# Patient Record
Sex: Male | Born: 2000 | Race: Black or African American | Hispanic: No | Marital: Single | Smoking: Never smoker
Health system: Southern US, Community
[De-identification: ages and names within clinical notes are randomized; demographics above are authoritative.]

---
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
## 2020-12-28 ENCOUNTER — Ambulatory Visit
Admission: RE | Admit: 2020-12-28 | Discharge: 2020-12-28 | Disposition: A | Discharge status: Home / Self Care | Payer: Self-pay | Source: Ambulatory Visit | Attending: Nurse Practitioner | Admitting: Nurse Practitioner

## 2020-12-28 ENCOUNTER — Other Ambulatory Visit: Payer: Self-pay | Admitting: Nurse Practitioner

## 2020-12-28 ENCOUNTER — Other Ambulatory Visit: Payer: Self-pay

## 2020-12-28 DIAGNOSIS — Z021 Encounter for pre-employment examination: Secondary | ICD-10-CM

## 2021-06-30 ENCOUNTER — Ambulatory Visit (HOSPITAL_BASED_OUTPATIENT_CLINIC_OR_DEPARTMENT_OTHER): Payer: 59 | Admitting: Family Medicine

## 2022-04-06 ENCOUNTER — Ambulatory Visit (HOSPITAL_BASED_OUTPATIENT_CLINIC_OR_DEPARTMENT_OTHER): Payer: BC Managed Care – PPO | Admitting: Family Medicine

## 2022-04-06 ENCOUNTER — Encounter (HOSPITAL_BASED_OUTPATIENT_CLINIC_OR_DEPARTMENT_OTHER): Payer: Self-pay | Admitting: Family Medicine

## 2022-04-06 DIAGNOSIS — Z0000 Encounter for general adult medical examination without abnormal findings: Secondary | ICD-10-CM

## 2022-04-06 DIAGNOSIS — Z7689 Persons encountering health services in other specified circumstances: Secondary | ICD-10-CM | POA: Insufficient documentation

## 2022-04-06 DIAGNOSIS — Z23 Encounter for immunization: Secondary | ICD-10-CM

## 2022-04-06 DIAGNOSIS — R0789 Other chest pain: Secondary | ICD-10-CM | POA: Diagnosis not present

## 2022-04-06 NOTE — Patient Instructions (Signed)
  Medication Instructions:  Your physician recommends that you continue on your current medications as directed. Please refer to the Current Medication list given to you today. --If you need a refill on any your medications before your next appointment, please call your pharmacy first. If no refills are authorized on file call the office.-- Lab Work: Your physician has recommended that you have lab work today: Yes If you have labs (blood work) drawn today and your tests are completely normal, you will receive your results via MyChart message OR a phone call from our staff.  Please ensure you check your voicemail in the event that you authorized detailed messages to be left on a delegated number. If you have any lab test that is abnormal or we need to change your treatment, we will call you to review the results.  Referrals/Procedures/Imaging: No  Follow-Up: Your next appointment:   Your physician recommends that you schedule a follow-up appointment in: 4-6 weeks with Dr. de Cuba.  You will receive a text message or e-mail with a link to a survey about your care and experience with us today! We would greatly appreciate your feedback!   Thanks for letting us be apart of your health journey!!  Primary Care and Sports Medicine   Dr. Raymond de Cuba   We encourage you to activate your patient portal called "MyChart".  Sign up information is provided on this After Visit Summary.  MyChart is used to connect with patients for Virtual Visits (Telemedicine).  Patients are able to view lab/test results, encounter notes, upcoming appointments, etc.  Non-urgent messages can be sent to your provider as well. To learn more about what you can do with MyChart, please visit --  [email or web address].    

## 2022-04-06 NOTE — Assessment & Plan Note (Signed)
We will arrange for CPE to be completed in the coming weeks, patient will complete labs today in anticipation of that appointment He does have some intermittent chest cramping/discomfort which do not appear to be related to cardiac etiology, recommend continue with monitoring He reports that some of his family numbers have had issues with seizures, he is not sure about any specific etiology which has been determined for these seizures.  He did have some questions about whether these may be placing high risk for him to developing seizures in the future.  We did discuss that for certain seizure etiologies, this could increase his risk, although there are numerous other causes of seizures which would not correlate with him having increased risk of having seizures in the future

## 2022-04-06 NOTE — Progress Notes (Signed)
New Patient Office Visit  Subjective    Patient ID: Henry Sanders, male    DOB: 2001/01/30  Age: 21 y.o. MRN: [id number]  CC:  Chief Complaint  Patient presents with   New Patient (Initial Visit)    Pt here to establish new care, pt only concern is he stated he works out and he has chest pain sometimes     HPI Henry Sanders presents to establish care Last PCP - pediatrician a few years ago  Denies any chronic medical issues, no regular medications taken Denies prior surgeries or hospitalizations Family history of HTN on father's side of family. Has had some occasional chest pain/cramping - this has primarily been associated with exercise, generally will feel symptoms the day following after chest related strength training.  Has not had symptoms otherwise, no symptoms with day-to-day activities.  No associated lightheadedness, dizziness or shortness of breath.  Patient is originally from [place]. He is currently working, works in Programmer, applications. Outside of work, he enjoys exercising, sports, hiking, traveling.  Outpatient Encounter Medications as of 04/06/2022  Medication Sig   [DISCONTINUED] meloxicam (MOBIC) 15 MG tablet Take 1 tablet (15 mg total) by mouth daily. (Patient not taking: Reported on 10/22/2017)   No facility-administered encounter medications on file as of 04/06/2022.    History reviewed. No pertinent past medical history.  History reviewed. No pertinent surgical history.  History reviewed. No pertinent family history.  Social History   Socioeconomic History   Marital status: Single    Spouse name: Not on file   Number of children: Not on file   Years of education: Not on file   Highest education level: Not on file  Occupational History   Not on file  Tobacco Use   Smoking status: Never   Smokeless tobacco: Never  Substance and Sexual Activity   Alcohol use: No   Drug use: No   Sexual activity: Not on file  Other Topics  Concern   Not on file  Social History Narrative   Not on file   Social Determinants of Health   Financial Resource Strain: Not on file  Food Insecurity: Not on file  Transportation Needs: Not on file  Physical Activity: Not on file  Stress: Not on file  Social Connections: Not on file  Intimate Partner Violence: Not on file    Objective    BP (!) 152/74   Pulse 66   Temp 97.8 F (36.6 C) (Oral)   Ht 6' (1.829 m)   Wt 270 lb (122.5 kg)   SpO2 100%   BMI 36.62 kg/m   Physical Exam  21 year old male in no acute distress Cardiovascular exam with regular rate and rhythm, no murmur appreciated Lungs clear to auscultation bilaterally  Assessment & Plan:   Problem List Items Addressed This Visit       Other   Encounter to establish care    We will arrange for CPE to be completed in the coming weeks, patient will complete labs today in anticipation of that appointment He does have some intermittent chest cramping/discomfort which do not appear to be related to cardiac etiology, recommend continue with monitoring He reports that some of his family numbers have had issues with seizures, he is not sure about any specific etiology which has been determined for these seizures.  He did have some questions about whether these may be placing high risk for him to developing seizures in the future.  We did discuss that for certain  seizure etiologies, this could increase his risk, although there are numerous other causes of seizures which would not correlate with him having increased risk of having seizures in the future      Other Visit Diagnoses     Wellness examination    -  Primary   Relevant Orders   CBC with Differential/Platelet   Comprehensive metabolic panel   Hemoglobin A1c   Lipid panel   TSH Rfx on Abnormal to Free T4   Need for influenza vaccination           Return in about 4 weeks (around 05/04/2022) for CPE.   Henry Gift J De Peru, MD

## 2022-04-07 LAB — CBC WITH DIFFERENTIAL/PLATELET
Basophils Absolute: 0 10*3/uL (ref 0.0–0.2)
Basos: 1 %
EOS (ABSOLUTE): 0.1 10*3/uL (ref 0.0–0.4)
Eos: 2 %
Hematocrit: 47.3 % (ref 37.5–51.0)
Hemoglobin: 16.4 g/dL (ref 13.0–17.7)
Immature Grans (Abs): 0 10*3/uL (ref 0.0–0.1)
Immature Granulocytes: 0 %
Lymphocytes Absolute: 2.1 10*3/uL (ref 0.7–3.1)
Lymphs: 44 %
MCH: 30.5 pg (ref 26.6–33.0)
MCHC: 34.7 g/dL (ref 31.5–35.7)
MCV: 88 fL (ref 79–97)
Monocytes Absolute: 0.5 10*3/uL (ref 0.1–0.9)
Monocytes: 12 %
Neutrophils Absolute: 1.9 10*3/uL (ref 1.4–7.0)
Neutrophils: 41 %
Platelets: 188 10*3/uL (ref 150–450)
RBC: 5.37 x10E6/uL (ref 4.14–5.80)
RDW: 12.1 % (ref 11.6–15.4)
WBC: 4.6 10*3/uL (ref 3.4–10.8)

## 2022-04-07 LAB — LIPID PANEL
Chol/HDL Ratio: 5.9 ratio — ABNORMAL HIGH (ref 0.0–5.0)
Cholesterol, Total: 223 mg/dL — ABNORMAL HIGH (ref 100–199)
HDL: 38 mg/dL — ABNORMAL LOW (ref >39)
LDL Chol Calc (NIH): 162 mg/dL — ABNORMAL HIGH (ref 0–99)
Triglycerides: 124 mg/dL (ref 0–149)
VLDL Cholesterol Cal: 23 mg/dL (ref 5–40)

## 2022-04-07 LAB — HEMOGLOBIN A1C
Est. average glucose Bld gHb Est-mCnc: 108 mg/dL
Hgb A1c MFr Bld: 5.4 % (ref 4.8–5.6)

## 2022-04-07 LAB — COMPREHENSIVE METABOLIC PANEL
ALT: 26 IU/L (ref 0–44)
AST: 27 IU/L (ref 0–40)
Albumin/Globulin Ratio: 1.5 (ref 1.2–2.2)
Albumin: 4.6 g/dL (ref 4.3–5.2)
Alkaline Phosphatase: 89 IU/L (ref 44–121)
BUN/Creatinine Ratio: 17 (ref 9–20)
BUN: 18 mg/dL (ref 6–20)
Bilirubin Total: 0.8 mg/dL (ref 0.0–1.2)
CO2: 23 mmol/L (ref 20–29)
Calcium: 9.5 mg/dL (ref 8.7–10.2)
Chloride: 101 mmol/L (ref 96–106)
Creatinine, Ser: 1.07 mg/dL (ref 0.76–1.27)
Globulin, Total: 3.1 g/dL (ref 1.5–4.5)
Glucose: 84 mg/dL (ref 70–99)
Potassium: 4.5 mmol/L (ref 3.5–5.2)
Sodium: 138 mmol/L (ref 134–144)
Total Protein: 7.7 g/dL (ref 6.0–8.5)
eGFR: 101 mL/min/{1.73_m2} (ref >59)

## 2022-04-07 LAB — TSH RFX ON ABNORMAL TO FREE T4: TSH: 0.952 u[IU]/mL (ref 0.450–4.500)

## 2022-04-09 NOTE — Addendum Note (Signed)
Addended by: DE Peru, Tavarius Grewe J on: 04/09/2022 10:16 AM   Modules accepted: Level of Service

## 2022-04-17 DIAGNOSIS — R0789 Other chest pain: Secondary | ICD-10-CM | POA: Insufficient documentation

## 2022-04-17 NOTE — Assessment & Plan Note (Signed)
Pain has been associated with strength training, will occur after the exercise. History and exam consistent with MSK etiology. Will continue to monitor for any new symptoms or changes in symptoms at this time.

## 2022-04-17 NOTE — Addendum Note (Signed)
Addended by: DE Peru, Mackinsey Pelland J on: 04/17/2022 08:03 AM   Modules accepted: Level of Service

## 2023-02-15 IMAGING — CR DG CHEST 1V
1 series · 1 of 1 positions shown · non-contrast
Comparison: None.

CLINICAL DATA: Preemployment exam for [employer].

EXAM:
CHEST  1 VIEW

[w chest pa]
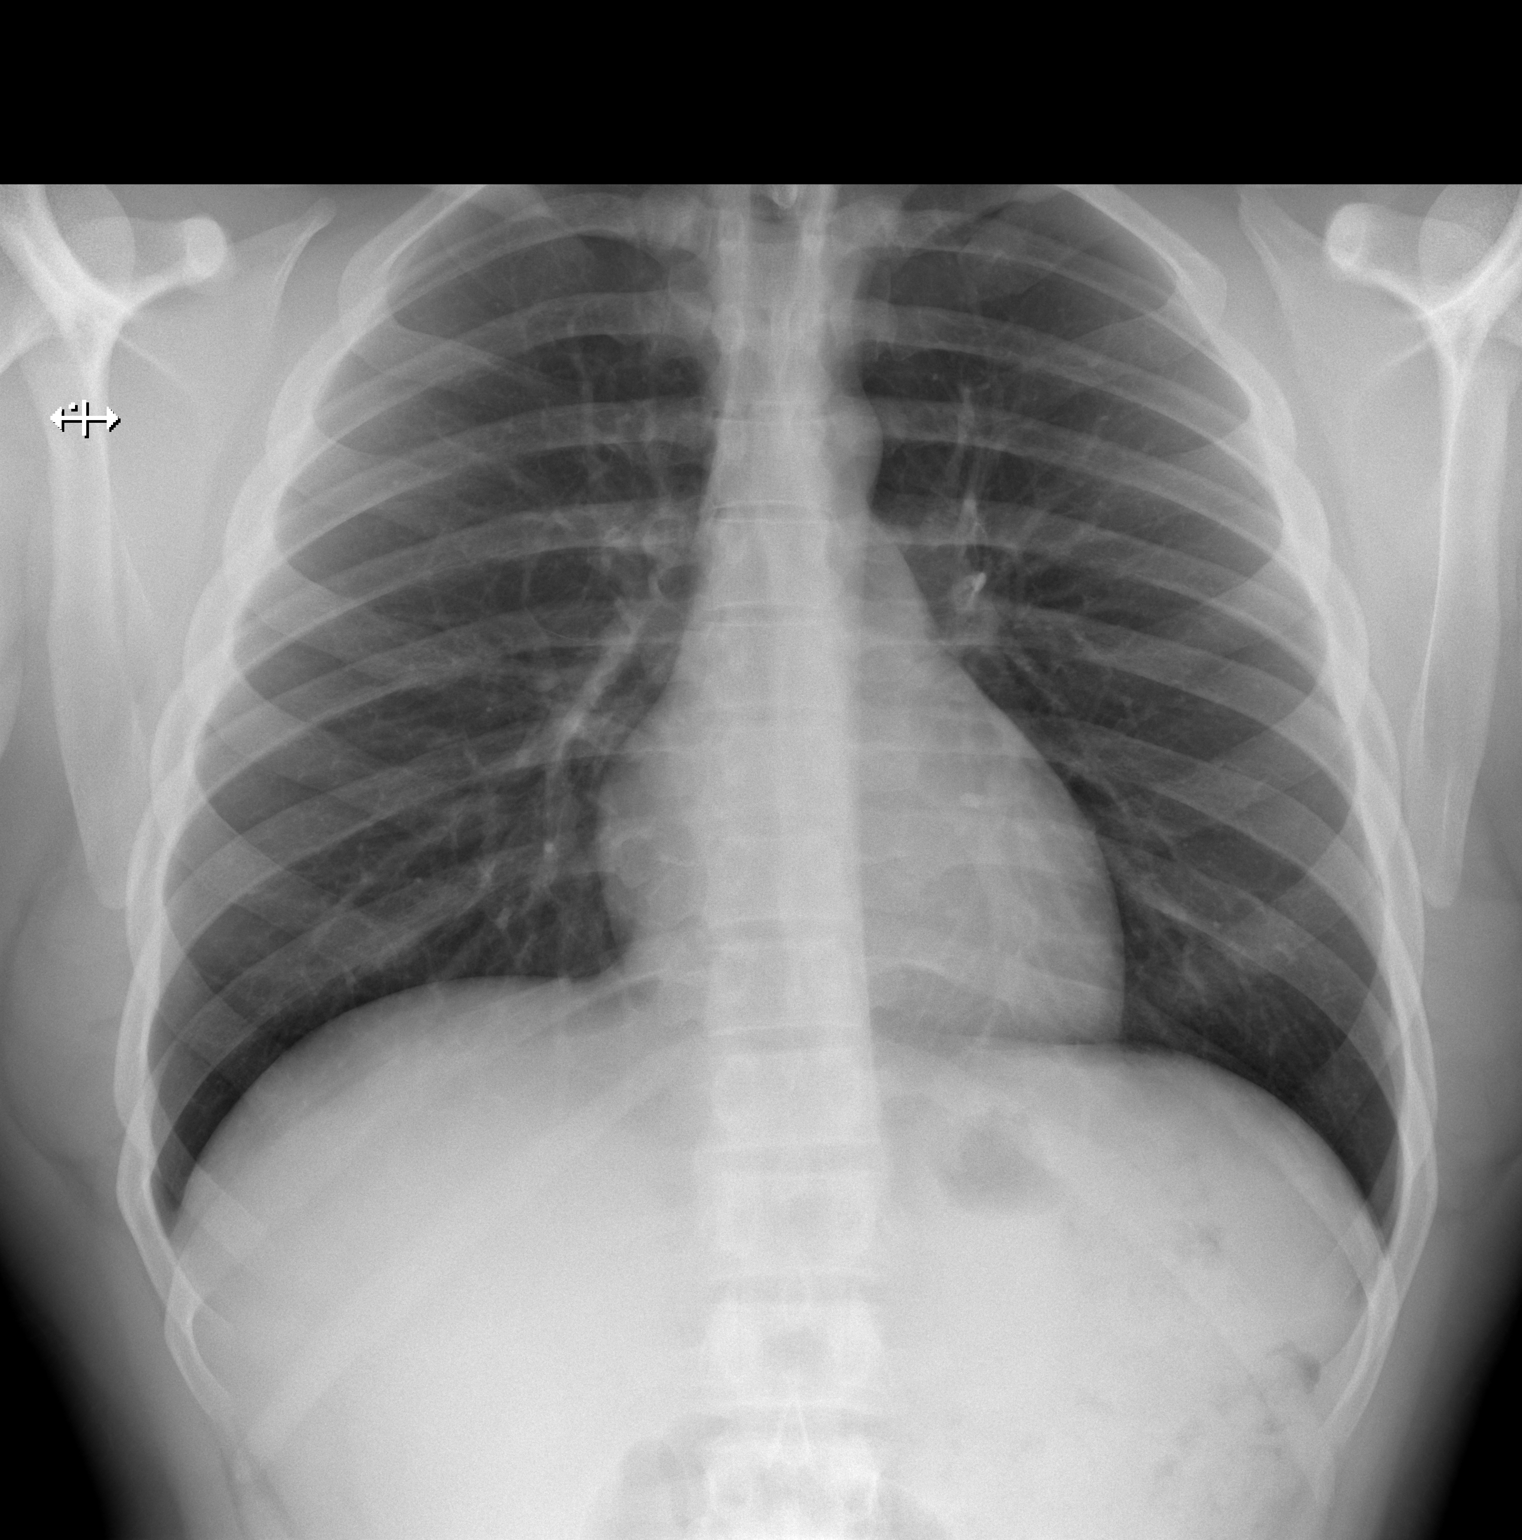

[1 of 1 positions shown; findings below may reference images not displayed]

FINDINGS: The heart size and mediastinal contours are within normal limits.
Both lungs are clear. The visualized skeletal structures are
unremarkable.
IMPRESSION: No active disease.

## 2023-07-12 DIAGNOSIS — H52223 Regular astigmatism, bilateral: Secondary | ICD-10-CM | POA: Diagnosis not present

## 2023-07-12 DIAGNOSIS — H5213 Myopia, bilateral: Secondary | ICD-10-CM | POA: Diagnosis not present

## 2024-03-20 ENCOUNTER — Ambulatory Visit (HOSPITAL_BASED_OUTPATIENT_CLINIC_OR_DEPARTMENT_OTHER): Admitting: Family Medicine

## 2024-07-17 ENCOUNTER — Encounter (HOSPITAL_BASED_OUTPATIENT_CLINIC_OR_DEPARTMENT_OTHER): Admitting: Family Medicine
